# Patient Record
Sex: Female | Born: 1995 | Race: Black or African American | Hispanic: No | Marital: Single | Smoking: Never smoker
Health system: Southern US, Community
[De-identification: ages and names within clinical notes are randomized; demographics above are authoritative.]

---

## 2019-02-12 ENCOUNTER — Emergency Department
Admission: EM | Admit: 2019-02-12 | Discharge: 2019-02-12 | Disposition: A | Discharge status: Home / Self Care | Payer: Medicaid Other | Attending: Emergency Medicine | Admitting: Emergency Medicine

## 2019-02-12 ENCOUNTER — Emergency Department: Payer: Medicaid Other

## 2019-02-12 ENCOUNTER — Other Ambulatory Visit: Payer: Self-pay

## 2019-02-12 ENCOUNTER — Encounter: Payer: Self-pay | Admitting: Emergency Medicine

## 2019-02-12 DIAGNOSIS — O26891 Other specified pregnancy related conditions, first trimester: Secondary | ICD-10-CM | POA: Insufficient documentation

## 2019-02-12 DIAGNOSIS — R102 Pelvic and perineal pain unspecified side: Secondary | ICD-10-CM

## 2019-02-12 DIAGNOSIS — Z3A01 Less than 8 weeks gestation of pregnancy: Secondary | ICD-10-CM | POA: Diagnosis not present

## 2019-02-12 DIAGNOSIS — Z3491 Encounter for supervision of normal pregnancy, unspecified, first trimester: Secondary | ICD-10-CM

## 2019-02-12 LAB — COMPREHENSIVE METABOLIC PANEL
ALT: 18 U/L (ref 0–44)
AST: 19 U/L (ref 15–41)
Albumin: 4 g/dL (ref 3.5–5.0)
Alkaline Phosphatase: 37 U/L — ABNORMAL LOW (ref 38–126)
Anion gap: 5 (ref 5–15)
BUN: 9 mg/dL (ref 6–20)
CO2: 22 mmol/L (ref 22–32)
Calcium: 9.1 mg/dL (ref 8.9–10.3)
Chloride: 107 mmol/L (ref 98–111)
Creatinine, Ser: 0.62 mg/dL (ref 0.44–1.00)
GFR calc Af Amer: >60 mL/min (ref >60)
GFR calc non Af Amer: >60 mL/min (ref >60)
Glucose, Bld: 85 mg/dL (ref 70–99)
Potassium: 3.8 mmol/L (ref 3.5–5.1)
Sodium: 134 mmol/L — ABNORMAL LOW (ref 135–145)
Total Bilirubin: 0.6 mg/dL (ref 0.3–1.2)
Total Protein: 7.9 g/dL (ref 6.5–8.1)

## 2019-02-12 LAB — URINALYSIS, COMPLETE (UACMP) WITH MICROSCOPIC
Bacteria, UA: NONE SEEN
Bilirubin Urine: NEGATIVE
Glucose, UA: NEGATIVE mg/dL
Hgb urine dipstick: NEGATIVE
Ketones, ur: NEGATIVE mg/dL
Leukocytes,Ua: NEGATIVE
Nitrite: NEGATIVE
Protein, ur: NEGATIVE mg/dL
Specific Gravity, Urine: 1.025 (ref 1.005–1.030)
pH: 7 (ref 5.0–8.0)

## 2019-02-12 LAB — HCG, QUANTITATIVE, PREGNANCY: hCG, Beta Chain, Quant, S: 43960 m[IU]/mL — ABNORMAL HIGH (ref <5)

## 2019-02-12 LAB — CBC
HCT: 39.5 % (ref 36.0–46.0)
Hemoglobin: 13.3 g/dL (ref 12.0–15.0)
MCH: 29 pg (ref 26.0–34.0)
MCHC: 33.7 g/dL (ref 30.0–36.0)
MCV: 86.2 fL (ref 80.0–100.0)
Platelets: 244 10*3/uL (ref 150–400)
RBC: 4.58 MIL/uL (ref 3.87–5.11)
RDW: 12.7 % (ref 11.5–15.5)
WBC: 7.6 10*3/uL (ref 4.0–10.5)
nRBC: 0 % (ref 0.0–0.2)

## 2019-02-12 LAB — CHLAMYDIA/NGC RT PCR (ARMC ONLY)
Chlamydia Tr: NOT DETECTED
N gonorrhoeae: NOT DETECTED

## 2019-02-12 LAB — POCT PREGNANCY, URINE: Preg Test, Ur: POSITIVE — AB

## 2019-02-12 NOTE — ED Provider Notes (Signed)
[facility] Emergency Department Provider Note  Time seen: 5:32 PM  I have reviewed the triage vital signs and the nursing notes.   HISTORY  Chief Complaint Pelvic Pain   HPI Karen Hickman is a 23 y.o. female with no significant past medical history presents to the emergency department for right-sided pelvic discomfort.  According to the patient for the past 3 to 4 days she has been experiencing intermittent moderate sharp pain in her right pelvis.  Patient denies any bleeding or vaginal discharge.  Denies any dysuria.  Patient states she was recently on Nexplanon but was having significant breakthrough bleeding so her doctor switched her to oral birth control tablets.  Patient recently moved and has been out of her birth control for approximately 1 month.  Patient states she recently had a pelvic exam performed approximately 2 months ago by her OB/GYN.  Denies any fever cough congestion or shortness of breath.  History reviewed. No pertinent past medical history.  There are no active problems to display for this patient.   History reviewed. No pertinent surgical history.  Prior to Admission medications   Not on File    No Known Allergies  No family history on file.  Social History Social History   Tobacco Use  . Smoking status: Never Smoker  . Smokeless tobacco: Never Used  Substance Use Topics  . Alcohol use: Yes    Frequency: Never    Comment: rare  . Drug use: Not on file    Review of Systems Constitutional: Negative for fever ENT: Negative for recent illness/congestion Cardiovascular: Negative for chest pain. Respiratory: Negative for shortness of breath. Gastrointestinal: Intermittent sharp pains in her right pelvis.  No abdominal pain, nausea vomiting or diarrhea Genitourinary: Negative for urinary compaints Musculoskeletal: Negative for musculoskeletal complaints Skin: Negative for skin complaints  Neurological: Negative for  headache All other ROS negative  ____________________________________________   PHYSICAL EXAM:  VITAL SIGNS: ED Triage Vitals  Enc Vitals Group     BP 02/12/19 1710 (!) 115/48     Pulse Rate 02/12/19 1710 88     Resp 02/12/19 1710 20     Temp 02/12/19 1710 98.6 F (37 C)     Temp Source 02/12/19 1710 Oral     SpO2 02/12/19 1710 99 %     Weight 02/12/19 1711 175 lb (79.4 kg)     Height 02/12/19 1711 5\' 3"  (1.6 m)     Head Circumference --      Peak Flow --      Pain Score 02/12/19 1710 3     Pain Loc --      Pain Edu? --      Excl. in [place]? --    Constitutional: Alert and oriented. Well appearing and in no distress. Eyes: Normal exam ENT      Head: Normocephalic and atraumatic.      Mouth/Throat: Mucous membranes are moist. Cardiovascular: Normal rate, regular rhythm Respiratory: Normal respiratory effort without tachypnea nor retractions. Breath sounds are clear  Gastrointestinal: Abdomen overall soft and nontender.  No tenderness over McBurney's point however she has mild tenderness in the very low right lower quadrant/right pelvic area. Musculoskeletal: Nontender with normal range of motion in all extremities.  Neurologic:  Normal speech and language. No gross focal neurologic deficits Skin:  Skin is warm, dry and intact.  Psychiatric: Mood and affect are normal.  ____________________________________________    RADIOLOGY  Sound shows 6-week IUP with a subchorionic hemorrhage  ____________________________________________  INITIAL IMPRESSION / ASSESSMENT AND PLAN / ED COURSE  Pertinent labs & imaging results that were available during my care of the patient were reviewed by me and considered in my medical decision making (see chart for details).   Patient presents emergency department for right-sided pelvic pain.  Patient states pain is intermittent, moderate and sharp has been occurring over the past 4 days.  Denies any significant discomfort currently.  Denies  any pain in her abdomen.  Patient does have mild tenderness to palpation in the very low right lower quadrant/right pelvis.  I discussed performing a pelvic examination checking a urine sample and obtaining an ultrasound.  Patient states she just had a pelvic exam performed 1 to 2 months ago and does not want a repeat pelvic exam.  Discussed with the patient is important to rule out things like STDs, patient understands but still wishes to forego the pelvic exam.  We will proceed with ultrasound and send a urine sample.  Patient agreeable to plan of care.  Differential at this time would include urinary tract infection, ovarian cyst, hemorrhagic cyst, less likely STD or pelvic infection.  Overall patient's physical exam is extremely reassuring with normal vitals and a largely benign abdomen besides very slight tenderness in the very low right lower quadrant.  No tenderness over McBurney's point.  Ultrasound shows 6-week IUP with a subchorionic hemorrhage, beta-hCG confirms pregnancy as well.  Patient will be discharged home with OB follow-up.  Patient agreeable to plan of care.  Alvino BloodLatavia Brakebill was evaluated in Emergency Department on 02/12/2019 for the symptoms described in the history of present illness. She was evaluated in the context of the global COVID-19 pandemic, which necessitated consideration that the patient might be at risk for infection with the SARS-CoV-2 virus that causes COVID-19. Institutional protocols and algorithms that pertain to the evaluation of patients at risk for COVID-19 are in a state of rapid change based on information released by regulatory bodies including the CDC and federal and state organizations. These policies and algorithms were followed during the patient's care in the ED.  ____________________________________________   FINAL CLINICAL IMPRESSION(S) / ED DIAGNOSES  First trimester pregnancy   Minna AntisPaduchowski, Nicholl Onstott, MD 02/12/19 2040

## 2019-02-12 NOTE — Discharge Instructions (Addendum)
Please follow-up with OB/GYN within the next 4 weeks for recheck/reevaluation.  Return to the emergency department for any abdominal pain, vaginal bleeding, or any other symptom personally concerning to yourself.

## 2019-02-12 NOTE — ED Notes (Addendum)
Pt's POC Pregnancy Test came back POSITIVE. This test read by this tech and First Nurse Judeth Cornfield, RN also confirmed POSITIVE test.

## 2019-02-12 NOTE — ED Notes (Signed)
Patient transported to Ultrasound 

## 2019-02-12 NOTE — ED Triage Notes (Signed)
Patient to ER for c/o pelvic pain x2 days. Denies any discharge or bleeding. Patient states there is a chance that she is pregnant, but does not know for sure. Denies any fevers or burning with urination. States she was on nexplanon for one month, then was on pill. States she then moved and has been unable to get prescription for birth control.

## 2019-03-09 ENCOUNTER — Other Ambulatory Visit: Payer: Self-pay

## 2019-03-09 ENCOUNTER — Emergency Department
Admission: EM | Admit: 2019-03-09 | Discharge: 2019-03-09 | Disposition: A | Discharge status: Home / Self Care | Payer: Medicaid Other | Attending: Emergency Medicine | Admitting: Emergency Medicine

## 2019-03-09 DIAGNOSIS — R109 Unspecified abdominal pain: Secondary | ICD-10-CM | POA: Insufficient documentation

## 2019-03-09 DIAGNOSIS — O26891 Other specified pregnancy related conditions, first trimester: Secondary | ICD-10-CM | POA: Diagnosis present

## 2019-03-09 LAB — CBC WITH DIFFERENTIAL/PLATELET
Abs Immature Granulocytes: 0.01 10*3/uL (ref 0.00–0.07)
Basophils Absolute: 0 10*3/uL (ref 0.0–0.1)
Basophils Relative: 0 %
Eosinophils Absolute: 0.1 10*3/uL (ref 0.0–0.5)
Eosinophils Relative: 1 %
HCT: 39.2 % (ref 36.0–46.0)
Hemoglobin: 13.4 g/dL (ref 12.0–15.0)
Immature Granulocytes: 0 %
Lymphocytes Relative: 27 %
Lymphs Abs: 2.1 10*3/uL (ref 0.7–4.0)
MCH: 29.5 pg (ref 26.0–34.0)
MCHC: 34.2 g/dL (ref 30.0–36.0)
MCV: 86.2 fL (ref 80.0–100.0)
Monocytes Absolute: 0.3 10*3/uL (ref 0.1–1.0)
Monocytes Relative: 4 %
Neutro Abs: 5.2 10*3/uL (ref 1.7–7.7)
Neutrophils Relative %: 68 %
Platelets: 249 10*3/uL (ref 150–400)
RBC: 4.55 MIL/uL (ref 3.87–5.11)
RDW: 12.6 % (ref 11.5–15.5)
WBC: 7.7 10*3/uL (ref 4.0–10.5)
nRBC: 0 % (ref 0.0–0.2)

## 2019-03-09 LAB — BASIC METABOLIC PANEL
Anion gap: 10 (ref 5–15)
BUN: 11 mg/dL (ref 6–20)
CO2: 21 mmol/L — ABNORMAL LOW (ref 22–32)
Calcium: 9 mg/dL (ref 8.9–10.3)
Chloride: 103 mmol/L (ref 98–111)
Creatinine, Ser: 0.71 mg/dL (ref 0.44–1.00)
GFR calc Af Amer: >60 mL/min (ref >60)
GFR calc non Af Amer: >60 mL/min (ref >60)
Glucose, Bld: 85 mg/dL (ref 70–99)
Potassium: 3.8 mmol/L (ref 3.5–5.1)
Sodium: 134 mmol/L — ABNORMAL LOW (ref 135–145)

## 2019-03-09 LAB — ABO/RH: ABO/RH(D): A POS

## 2019-03-09 LAB — HCG, QUANTITATIVE, PREGNANCY: hCG, Beta Chain, Quant, S: 49443 m[IU]/mL — ABNORMAL HIGH (ref <5)

## 2019-03-09 LAB — POCT PREGNANCY, URINE: Preg Test, Ur: POSITIVE — AB

## 2019-03-09 NOTE — ED Triage Notes (Addendum)
Pt arrives to ed via POV from home. Pt states she is [redacted] weeks pregnant. Woke up with headache on rt side, that has been intermittent throughout today. C/o right sided abd cramping lasting 10-15 mins, unable to sit or stand up at that time, had to lay flat, states she felt pressure in pelvic region. Denies any vaginal bleeding, or discharge. States cramping and pain has subsided but concerned due to pregnancy.

## 2019-03-09 NOTE — ED Notes (Signed)
Pt in bathroom to collect urine specimen.

## 2019-03-09 NOTE — Discharge Instructions (Addendum)
As we discussed it is very important that you start taking prenatal vitamins. Please seek medical attention for any high fevers, chest pain, shortness of breath, change in behavior, persistent vomiting, bloody stool or any other new or concerning symptoms.

## 2019-03-09 NOTE — ED Provider Notes (Signed)
[facility] Emergency Department Provider Note   ____________________________________________   I have reviewed the triage vital signs and the nursing notes.   HISTORY  Chief Complaint Abdominal Pain   History limited by: Not Limited   HPI Karen Hickman is a 23 y.o. female who presents to the emergency department today because of concern for abdominal pain that occurred earlier today. She was reaching down when it started. Located in the right lower quadrant. It felt like severe cramping. Rated it a 10/10. She states it lasted for roughly 15 minutes before starting to ease off. The patient denies any vaginal bleeding. Is in the first trimester of pregnancy. Has not yet established ob/gyn care. In addition she states she did have a headache earlier in the day.    Records reviewed. Per medical record review patient has a history of ER visit last month with US showing a single live IUP.   No past medical history on file.  There are no active problems to display for this patient.   No past surgical history on file.  Prior to Admission medications   Not on File    Allergies Patient has no known allergies.  No family history on file.  Social History Social History   Tobacco Use  . Smoking status: Never Smoker  . Smokeless tobacco: Never Used  Substance Use Topics  . Alcohol use: Not Currently    Frequency: Never    Comment: rare  . Drug use: Not on file    Review of Systems Constitutional: No fever/chills Eyes: No visual changes. ENT: No sore throat. Cardiovascular: Denies chest pain. Respiratory: Denies shortness of breath. Gastrointestinal: Positive for abdominal pain.  Genitourinary: Negative for dysuria. Musculoskeletal: Negative for back pain. Skin: Negative for rash. Neurological: Positive for headache which has resolved.  ____________________________________________   PHYSICAL EXAM:  VITAL SIGNS: ED Triage Vitals  Enc  Vitals Group     BP 03/09/19 1745 114/73     Pulse Rate 03/09/19 1745 83     Resp 03/09/19 1745 18     Temp 03/09/19 1745 98.5 F (36.9 C)     Temp Source 03/09/19 1745 Oral     SpO2 03/09/19 1745 100 %     Weight 03/09/19 1745 185 lb (83.9 kg)     Height 03/09/19 1745 5\' 3"  (1.6 m)     Head Circumference --      Peak Flow --      Pain Score 03/09/19 1800 10   Constitutional: Alert and oriented.  Eyes: Conjunctivae are normal.  ENT      Head: Normocephalic and atraumatic.      Nose: No congestion/rhinnorhea.      Mouth/Throat: Mucous membranes are moist.      Neck: No stridor. Hematological/Lymphatic/Immunilogical: No cervical lymphadenopathy. Cardiovascular: Normal rate, regular rhythm.  No murmurs, rubs, or gallops.  Respiratory: Normal respiratory effort without tachypnea nor retractions. Breath sounds are clear and equal bilaterally. No wheezes/rales/rhonchi. Gastrointestinal: Soft and non tender. No rebound. No guarding.  Genitourinary: Deferred Musculoskeletal: Normal range of motion in all extremities. Neurologic:  Normal speech and language. No gross focal neurologic deficits are appreciated.  Skin:  Skin is warm, dry and intact. No rash noted. Psychiatric: Mood and affect are normal. Speech and behavior are normal. Patient exhibits appropriate insight and judgment.  ____________________________________________    LABS (pertinent positives/negatives)  CBC wbc 7.7, hgb 13.4, plt 249 BMP wnl except na 134, co2 21 Upreg positive  ____________________________________________  EKG  None  ____________________________________________    RADIOLOGY  None  ____________________________________________   PROCEDURES  Procedures  ____________________________________________   INITIAL IMPRESSION / ASSESSMENT AND PLAN / ED COURSE  Pertinent labs & imaging results that were available during my care of the patient were reviewed by me and considered in my  medical decision making (see chart for details).   Patient presented to the emergency department today because of concern for abdominal pain in the setting of early pregnancy. Blood work without concerning leukocytosis and patient is afebrile. I doubt appendicitis or other intraabdominal infection. Bedside ultrasound shows a single IUP with good cardiac activity and motion. Did discuss with patient importance of ob/gyn follow up and starting prenatal vitamins.  ____________________________________________   FINAL CLINICAL IMPRESSION(S) / ED DIAGNOSES  Final diagnoses:  Abdominal pain during pregnancy in first trimester     Note: This dictation was prepared with Dragon dictation. Any transcriptional errors that result from this process are unintentional     Phineas SemenGoodman, Ethie Curless, MD 03/09/19 1925

## 2019-05-02 ENCOUNTER — Encounter: Payer: Self-pay | Admitting: Emergency Medicine

## 2019-05-02 ENCOUNTER — Emergency Department
Admission: EM | Admit: 2019-05-02 | Discharge: 2019-05-02 | Disposition: A | Discharge status: Home / Self Care | Payer: Medicaid Other | Attending: Emergency Medicine | Admitting: Emergency Medicine

## 2019-05-02 ENCOUNTER — Other Ambulatory Visit: Payer: Self-pay

## 2019-05-02 DIAGNOSIS — J0100 Acute maxillary sinusitis, unspecified: Secondary | ICD-10-CM | POA: Insufficient documentation

## 2019-05-02 DIAGNOSIS — R0981 Nasal congestion: Secondary | ICD-10-CM | POA: Diagnosis present

## 2019-05-02 MED ORDER — AMOXICILLIN 500 MG PO CAPS
500.0000 mg | ORAL_CAPSULE | Freq: Once | ORAL | Status: AC
  Administered 2019-05-02: 500 mg via ORAL
  Filled 2019-05-02: qty 1

## 2019-05-02 MED ORDER — CETIRIZINE HCL 10 MG PO CAPS: 10.0000 mg | ORAL_CAPSULE | Freq: Every day | ORAL | 0 refills | Status: DC

## 2019-05-02 MED ORDER — AMOXICILLIN 500 MG PO TABS: 500.0000 mg | ORAL_TABLET | Freq: Three times a day (TID) | ORAL | 0 refills | Status: DC

## 2019-05-02 NOTE — ED Triage Notes (Signed)
Pt presents to ED with sinus pressure and nasal drainage for a couple of months. 5 months pregnant. otc medications taken with no relief. Clear nasal drainage.

## 2019-05-02 NOTE — ED Provider Notes (Signed)
[facility] Emergency Department Provider Note ____________________________________________   First MD Initiated Contact with Patient 05/02/19 2236     (approximate)  I have reviewed the triage vital signs and the nursing notes.   HISTORY  Chief Complaint Nasal Congestion  HPI Karen Hickman is a 23 y.o. female who presents to the ER for treatment and evaluation of sinus pressure and nasal drainage for a couple of months.  She states that she has had some postnasal drip last couple months and feels like there is just started to turn into more of a sinus infection.  She has had some pressure behind her eyes and a slight headache.  She now has pain in her sinus areas as well.  She is approximately 5 months pregnant and was afraid to take anything without being evaluated.         History reviewed. No pertinent past medical history.  There are no active problems to display for this patient.   History reviewed. No pertinent surgical history.  Prior to Admission medications   Medication Sig Start Date End Date Taking? Authorizing Provider  amoxicillin (AMOXIL) 500 MG tablet Take 1 tablet (500 mg total) by mouth 3 (three) times daily. 05/02/19   Saud Bail, Johnette Abraham B, FNP  Cetirizine HCl 10 MG CAPS Take 1 capsule (10 mg total) by mouth daily. 05/02/19   Victorino Dike, FNP    Allergies Patient has no known allergies.  History reviewed. No pertinent family history.  Social History Social History   Tobacco Use  . Smoking status: Never Smoker  . Smokeless tobacco: Never Used  Substance Use Topics  . Alcohol use: Not Currently    Frequency: Never    Comment: rare  . Drug use: Not on file    Review of Systems  Constitutional: No fever/chills Eyes: No visual changes. ENT: No sore throat.  Positive for facial pain Cardiovascular: Denies chest pain. Respiratory: Denies shortness of breath. Gastrointestinal: No abdominal pain.  No nausea, no vomiting.   No diarrhea.  No constipation. Genitourinary: Negative for dysuria. Musculoskeletal: Negative for back pain. Skin: Negative for rash. Neurological: Positive for headaches, focal weakness or numbness.  ____________________________________________   PHYSICAL EXAM:  VITAL SIGNS: ED Triage Vitals  Enc Vitals Group     BP 05/02/19 2100 125/70     Pulse Rate 05/02/19 2100 99     Resp 05/02/19 2100 20     Temp 05/02/19 2100 98.4 F (36.9 C)     Temp Source 05/02/19 2100 Oral     SpO2 05/02/19 2100 99 %     Weight 05/02/19 2101 200 lb (90.7 kg)     Height 05/02/19 2101 5\' 3"  (1.6 m)     Head Circumference --      Peak Flow --      Pain Score 05/02/19 2101 2     Pain Loc --      Pain Edu? --      Excl. in [place]? --     Constitutional: Alert and oriented. Well appearing and in no acute distress. Eyes: Conjunctivae are normal. PERRL. EOMI. Head: Atraumatic.  Tender over bilateral maxillary sinus areas Nose: No congestion/rhinnorhea. Mouth/Throat: Mucous membranes are moist.  Oropharynx non-erythematous. Neck: No stridor.   Hematological/Lymphatic/Immunilogical: No cervical lymphadenopathy. Cardiovascular: Normal rate, regular rhythm. Grossly normal heart sounds.  Good peripheral circulation. Respiratory: Normal respiratory effort.  No retractions. Lungs CTAB. Gastrointestinal: Soft and nontender. No distention. No abdominal bruits. No CVA tenderness. Genitourinary:  Musculoskeletal:  No lower extremity tenderness nor edema.  No joint effusions. Neurologic:  Normal speech and language. No gross focal neurologic deficits are appreciated. No gait instability. Skin:  Skin is warm, dry and intact. No rash noted. Psychiatric: Mood and affect are normal. Speech and behavior are normal.  ____________________________________________   LABS (all labs ordered are listed, but only abnormal results are displayed)  Labs Reviewed - No data to display  ____________________________________________  EKG  Not indicated ____________________________________________  RADIOLOGY  ED MD interpretation:    Not indicated  Official radiology report(s): No results found.  ____________________________________________   PROCEDURES  Procedure(s) performed (including Critical Care):  Procedures  ____________________________________________   INITIAL IMPRESSION / ASSESSMENT AND PLAN     23 year old female presenting to the emergency department for treatment and evaluation of nasal congestion.  See HPI for further details.  ED COURSE  Patient will be treated with amoxicillin and cetirizine.  She was advised that she should follow-up with her primary care provider if she has been like this is helping.  She was advised that she can take Tylenol if she needs of her headache.  She was advised to return to the emergency department for symptoms of concern if unable to schedule an appointment with primary care or her OB/GYN. ____________________________________________   FINAL CLINICAL IMPRESSION(S) / ED DIAGNOSES  Final diagnoses:  Acute non-recurrent maxillary sinusitis     ED Discharge Orders         Ordered    amoxicillin (AMOXIL) 500 MG tablet  3 times daily     05/02/19 2246    Cetirizine HCl 10 MG CAPS  Daily,   Status:  Discontinued     05/02/19 2246    Cetirizine HCl 10 MG CAPS  Daily     05/02/19 2248           Note:  This document was prepared using Dragon voice recognition software and may include unintentional dictation errors.   Chinita Pesterriplett, Kitzia Camus B, FNP 05/03/19 2246    Jeanmarie PlantMcShane, James A, MD 05/03/19 [phone]

## 2019-05-02 NOTE — Discharge Instructions (Signed)
You may also take plain Mucinex if needed. Take tylenol if needed for headache.

## 2019-05-31 ENCOUNTER — Emergency Department
Admission: EM | Admit: 2019-05-31 | Discharge: 2019-05-31 | Disposition: A | Discharge status: Home / Self Care | Payer: Medicaid Other | Attending: Emergency Medicine | Admitting: Emergency Medicine

## 2019-05-31 ENCOUNTER — Other Ambulatory Visit: Payer: Self-pay

## 2019-05-31 DIAGNOSIS — R0982 Postnasal drip: Secondary | ICD-10-CM | POA: Diagnosis not present

## 2019-05-31 DIAGNOSIS — R070 Pain in throat: Secondary | ICD-10-CM | POA: Diagnosis present

## 2019-05-31 DIAGNOSIS — Z79899 Other long term (current) drug therapy: Secondary | ICD-10-CM | POA: Insufficient documentation

## 2019-05-31 LAB — GROUP A STREP BY PCR: Group A Strep by PCR: NOT DETECTED

## 2019-05-31 MED ORDER — CETIRIZINE HCL 10 MG PO TABS: 10.0000 mg | ORAL_TABLET | Freq: Every day | ORAL | 0 refills | Status: AC

## 2019-05-31 MED ORDER — FLUTICASONE PROPIONATE 50 MCG/ACT NA SUSP: 1.0000 | Freq: Two times a day (BID) | NASAL | 0 refills | Status: DC

## 2019-05-31 NOTE — ED Triage Notes (Signed)
Reports she was prescribed amoxicillin for URI X 1 month ago. Pt states that she does not take the ABX as prescribed but takes them on days that she does not feel well. Thinks that the amoxicillin is making it difficult for her to swallow.

## 2019-05-31 NOTE — ED Provider Notes (Signed)
[facility] Emergency Department Provider Note  ____________________________________________  Time seen: Approximately 6:14 PM  I have reviewed the triage vital signs and the nursing notes.   HISTORY  Chief Complaint Throat Irritation    HPI Karen Hickman is a 23 y.o. female who presents the emergency department complaining of constant sensation of "something in the back of my throat."  Patient was seen approximately a month ago diagnosed with a sinus infection and placed on antibiotics and Zyrtec.  Patient reports that she is not taking any of the Zyrtec, only takes the antibiotic 1 day at a time depending on her symptoms.  Patient has had no fevers or chills, sinus pressure, headache, cough, shortness of breath, abdominal pain, nausea or vomiting.  Patient is pregnant at this time and has not been attempting use of other over-the-counter medications due to her pregnancy status.   Patient denies any complaints.  Patient is pregnant denies any pregnancy concerns.  Patient is G2, P1.  Gestational age is 22 weeks 1 day       History reviewed. No pertinent past medical history.  There are no active problems to display for this patient.   History reviewed. No pertinent surgical history.  Prior to Admission medications   Medication Sig Start Date End Date Taking? Authorizing Provider  amoxicillin (AMOXIL) 500 MG tablet Take 1 tablet (500 mg total) by mouth 3 (three) times daily. 05/02/19   Triplett, Rulon Eisenmenger B, FNP  cetirizine (ZYRTEC) 10 MG tablet Take 1 tablet (10 mg total) by mouth daily. 05/31/19   Tyhir Schwan, Delorise Royals, PA-C  fluticasone (FLONASE) 50 MCG/ACT nasal spray Place 1 spray into both nostrils 2 (two) times daily. 05/31/19   Lamondre Wesche, Delorise Royals, PA-C    Allergies Patient has no known allergies.  No family history on file.  Social History Social History   Tobacco Use  . Smoking status: Never Smoker  . Smokeless tobacco: Never Used  Substance  Use Topics  . Alcohol use: Not Currently    Frequency: Never    Comment: rare  . Drug use: Not on file     Review of Systems  Constitutional: No fever/chills Eyes: No visual changes. No discharge ENT: Congested/foreign body sensation in the back of the throat. Cardiovascular: no chest pain. Respiratory: no cough. No SOB. Gastrointestinal: No abdominal pain.  No nausea, no vomiting.  No diarrhea.  No constipation. Musculoskeletal: Negative for musculoskeletal pain. Skin: Negative for rash, abrasions, lacerations, ecchymosis. Neurological: Negative for headaches, focal weakness or numbness. 10-point ROS otherwise negative.  ____________________________________________   PHYSICAL EXAM:  VITAL SIGNS: ED Triage Vitals [05/31/19 1746]  Enc Vitals Group     BP 113/69     Pulse Rate 93     Resp 18     Temp 98.7 F (37.1 C)     Temp Source Oral     SpO2 100 %     Weight 209 lb (94.8 kg)     Height 5\' 3"  (1.6 m)     Head Circumference      Peak Flow      Pain Score 0     Pain Loc      Pain Edu?      Excl. in GC?      Constitutional: Alert and oriented. Well appearing and in no acute distress. Eyes: Conjunctivae are normal. PERRL. EOMI. Head: Atraumatic. ENT:      Ears:       Nose: No congestion/rhinnorhea.  Turbinates are boggy.  No tenderness to percussion over the sinuses.      Mouth/Throat: Mucous membranes are moist.  Oropharynx is nonerythematous and nonedematous.  Uvula is midline.  Posterior nasal drainage is identified in the posterior oropharynx.  No other findings in the oropharyngeal cavity Neck: No stridor.  Neck is supple full range of motion Hematological/Lymphatic/Immunilogical: No cervical lymphadenopathy. Cardiovascular: Normal rate, regular rhythm. Normal S1 and S2.  Good peripheral circulation. Respiratory: Normal respiratory effort without tachypnea or retractions. Lungs CTAB. Good air entry to the bases with no decreased or absent breath  sounds. Gastrointestinal: Gravid abdomen.  Bowel sounds 4 quadrants. Soft and nontender to palpation. No guarding or rigidity. No palpable masses. No distention. No CVA tenderness. Musculoskeletal: Full range of motion to all extremities. No gross deformities appreciated. Neurologic:  Normal speech and language. No gross focal neurologic deficits are appreciated.  Skin:  Skin is warm, dry and intact. No rash noted. Psychiatric: Mood and affect are normal. Speech and behavior are normal. Patient exhibits appropriate insight and judgement.   ____________________________________________   LABS (all labs ordered are listed, but only abnormal results are displayed)  Labs Reviewed  GROUP A STREP BY PCR   ____________________________________________  EKG   ____________________________________________  RADIOLOGY   No results found.  ____________________________________________    PROCEDURES  Procedure(s) performed:    Procedures    Medications - No data to display   ____________________________________________   INITIAL IMPRESSION / ASSESSMENT AND PLAN / ED COURSE  Pertinent labs & imaging results that were available during my care of the patient were reviewed by me and considered in my medical decision making (see chart for details).  Review of the [place] CSRS was performed in accordance of the [place] prior to dispensing any controlled drugs.  Clinical Course as of May 30 1850  Fri May 31, 2019  1848 Patient presented to the emergency department with foreign body sensation in throat.  Patient feels like the uvula is swollen.  Exam reveals postnasal drip but no other identifiable findings on physical exam.  Patient has been complaining of symptoms x1 month.  She was placed on antibiotics for sinus infection but did not take them as prescribed.  Patient has taken a dose of antibiotic as she is felt symptoms.  On review of patient's chart, it appears that patient has seen  multiple providers for similar complaint over the past month.  Patient has been diagnosed with postnasal drip/allergic rhinitis by multiple different providers and started on allergy medications as well as the antibiotic from this department.  Patient has not taken any of the allergy medications, only takes the antibiotics 1 dose at a time when she feels symptoms.  Findings today are consistent with allergic rhinitis with postnasal drip.  Patient will again be prescribed Zyrtec and Flonase.  I will test for strep but have a very low suspicion at this time.   [JC]    Clinical Course User Index [JC] Danon Lograsso, Charline Bills, PA-C          Patient's diagnosis is consistent with postnasal drip.  Exam was reassuring.  No acute findings warranting further investigation other than strep test.  Exam revealed no findings concerning for peritonsillar abscess, retropharyngeal abscess, Ludwick's angina, Lemierre's disease.  Differential included pharyngitis, strep pharyngitis, postnasal drip, viral URI, sinusitis.  Patient will be placed on Flonase and Zyrtec for symptom relief.  Follow-up with primary care or OB/GYN as needed.. Patient is given ED precautions to return to the ED for any worsening  or new symptoms.     ____________________________________________  FINAL CLINICAL IMPRESSION(S) / ED DIAGNOSES  Final diagnoses:  Post-nasal drip      NEW MEDICATIONS STARTED DURING THIS VISIT:  ED Discharge Orders         Ordered    fluticasone (FLONASE) 50 MCG/ACT nasal spray  2 times daily     05/31/19 1850    cetirizine (ZYRTEC) 10 MG tablet  Daily     05/31/19 1850              This chart was dictated using voice recognition software/Dragon. Despite best efforts to proofread, errors can occur which can change the meaning. Any change was purely unintentional.    Racheal PatchesCuthriell, Charan Prieto D, PA-C 05/31/19 1851    Shaune PollackIsaacs, [place], MD 06/01/19 1029

## 2019-05-31 NOTE — ED Notes (Signed)
PT states feels like something is in her throat often and has to clear her throat of saliva and mucous. No trouble breathing, Respirations even and unlabored, voice is clear.

## 2019-09-13 ENCOUNTER — Encounter: Payer: Self-pay | Admitting: Emergency Medicine

## 2019-09-13 ENCOUNTER — Other Ambulatory Visit: Payer: Self-pay

## 2019-09-13 ENCOUNTER — Emergency Department: Payer: Medicaid Other

## 2019-09-13 ENCOUNTER — Emergency Department
Admission: EM | Admit: 2019-09-13 | Discharge: 2019-09-13 | Disposition: A | Discharge status: Home / Self Care | Payer: Medicaid Other | Attending: Student in an Organized Health Care Education/Training Program | Admitting: Student in an Organized Health Care Education/Training Program

## 2019-09-13 DIAGNOSIS — Z79899 Other long term (current) drug therapy: Secondary | ICD-10-CM | POA: Insufficient documentation

## 2019-09-13 DIAGNOSIS — M79604 Pain in right leg: Secondary | ICD-10-CM | POA: Insufficient documentation

## 2019-09-13 DIAGNOSIS — R300 Dysuria: Secondary | ICD-10-CM | POA: Diagnosis present

## 2019-09-13 DIAGNOSIS — Z3A37 37 weeks gestation of pregnancy: Secondary | ICD-10-CM | POA: Insufficient documentation

## 2019-09-13 LAB — URINALYSIS, ROUTINE W REFLEX MICROSCOPIC
Bilirubin Urine: NEGATIVE
Glucose, UA: NEGATIVE mg/dL
Hgb urine dipstick: NEGATIVE
Ketones, ur: NEGATIVE mg/dL
Nitrite: NEGATIVE
Protein, ur: NEGATIVE mg/dL
Specific Gravity, Urine: 1.016 (ref 1.005–1.030)
pH: 7 (ref 5.0–8.0)

## 2019-09-13 LAB — WET PREP, GENITAL
Clue Cells Wet Prep HPF POC: NONE SEEN
Sperm: NONE SEEN
Trich, Wet Prep: NONE SEEN
Yeast Wet Prep HPF POC: NONE SEEN

## 2019-09-13 NOTE — ED Triage Notes (Signed)
Pt here with c/o burning with urination, is concerned for UTI, STD/STI. Wanting to be checked for all. States white discharge, also mucus discharge, pt is [redacted] weeks pregnant. Denies blood in urine, positive fetal movement, NAD.

## 2019-09-13 NOTE — ED Notes (Signed)
See triage note  Presents with some dysuria for couple of days        Pt is 37 weeks preg and having some right calf tenderness  Ambulates well

## 2019-09-13 NOTE — Discharge Instructions (Addendum)
Follow-up with your regular doctor.  Please return emergency department worsening.

## 2019-09-13 NOTE — ED Provider Notes (Signed)
[facility] Emergency Department Provider Note  ____________________________________________   First MD Initiated Contact with Patient 09/13/19 1145     (approximate)  I have reviewed the triage vital signs and the nursing notes.   HISTORY  Chief Complaint Urinary Tract Infection    HPI Karen Hickman is a 23 y.o. female presents emergency department complaint of burning with urination, unsure if she has an STD due to the vaginal discharge, and right lower extremity pain.  States her physician told her to get the right lower leg evaluated.  She states she thinks she has a UTI but since she is the test is negative she likely tested for STDs.  She denies any fever or chills.  Positive fetal movement.    History reviewed. No pertinent past medical history.  There are no problems to display for this patient.   History reviewed. No pertinent surgical history.  Prior to Admission medications   Medication Sig Start Date End Date Taking? Authorizing Provider  cetirizine (ZYRTEC) 10 MG tablet Take 1 tablet (10 mg total) by mouth daily. 05/31/19   Cuthriell, Charline Bills, PA-C    Allergies Patient has no known allergies.  No family history on file.  Social History Social History   Tobacco Use  . Smoking status: Never Smoker  . Smokeless tobacco: Never Used  Substance Use Topics  . Alcohol use: Not Currently    Comment: rare  . Drug use: Not on file    Review of Systems  Constitutional: No fever/chills Eyes: No visual changes. ENT: No sore throat. Respiratory: Denies cough Genitourinary: Negative for dysuria.  Positive vaginal discharge Musculoskeletal: Negative for back pain.  Positive right lower extremity pain Skin: Negative for rash.    ____________________________________________   PHYSICAL EXAM:  VITAL SIGNS: ED Triage Vitals  Enc Vitals Group     BP 09/13/19 1007 97/60     Pulse Rate 09/13/19 1007 100     Resp 09/13/19 1007 15      Temp 09/13/19 1007 98 F (36.7 C)     Temp Source 09/13/19 1007 Oral     SpO2 09/13/19 1007 100 %     Weight 09/13/19 1007 214 lb (97.1 kg)     Height 09/13/19 1007 5\' 3"  (1.6 m)     Head Circumference --      Peak Flow --      Pain Score 09/13/19 1026 0     Pain Loc --      Pain Edu? --      Excl. in [place]? --     Constitutional: Alert and oriented. Well appearing and in no acute distress. Eyes: Conjunctivae are normal.  Head: Atraumatic. Nose: No congestion/rhinnorhea. Mouth/Throat: Mucous membranes are moist.   Neck:  supple no lymphadenopathy noted Cardiovascular: Normal rate, regular rhythm.  Respiratory: Normal respiratory effort.  No retractions,  GU: No herpetic lesions noted.  White vaginal discharge noted Musculoskeletal: FROM all extremities, warm and well perfused, right lumbar is tender posteriorly, no redness is noted, neurovascular is intact Neurologic:  Normal speech and language.  Skin:  Skin is warm, dry and intact. No rash noted. Psychiatric: Mood and affect are normal. Speech and behavior are normal.  ____________________________________________   LABS (all labs ordered are listed, but only abnormal results are displayed)  Labs Reviewed  WET PREP, GENITAL - Abnormal; Notable for the following components:      Result Value   WBC, Wet Prep HPF POC FEW (*)  All other components within normal limits  URINALYSIS, ROUTINE W REFLEX MICROSCOPIC - Abnormal; Notable for the following components:   Color, Urine YELLOW (*)    APPearance CLEAR (*)    Leukocytes,Ua TRACE (*)    Bacteria, UA RARE (*)    All other components within normal limits  GC/CHLAMYDIA PROBE AMP   ____________________________________________   ____________________________________________  RADIOLOGY  Ultrasound right lower extremity is negative  ____________________________________________   PROCEDURES  Procedure(s) performed:  No  Procedures    ____________________________________________   INITIAL IMPRESSION / ASSESSMENT AND PLAN / ED COURSE  Pertinent labs & imaging results that were available during my care of the patient were reviewed by me and considered in my medical decision making (see chart for details).   Patient is 23 year old female presents emergency department with dysuria and right lower extremity pain.  She is [redacted] weeks pregnant.  Physical exam shows the right calf to be mildly tender.  Pelvic exam shows white discharge.  Ultrasound right lower extremity is negative for DVT Wet prep shows white blood cells only. GC/chlamydia is pending.    Explained findings to the patient.  Due to the low risk of an STD I advised her to wait for the results prior to being treated.  Especially since she is [redacted] weeks pregnant.  Nursing staff is to call her with the results.  Explained her she has not heard from [place] in 1 week to call the emergency department for her results.  She states she understands will comply.  She is to follow-up with regular doctor.  She was discharged stable condition.   Karen Hickman was evaluated in Emergency Department on 09/13/2019 for the symptoms described in the history of present illness. She was evaluated in the context of the global COVID-19 pandemic, which necessitated consideration that the patient might be at risk for infection with the SARS-CoV-2 virus that causes COVID-19. Institutional protocols and algorithms that pertain to the evaluation of patients at risk for COVID-19 are in a state of rapid change based on information released by regulatory bodies including the CDC and federal and state organizations. These policies and algorithms were followed during the patient's care in the ED.   As part of my medical decision making, I reviewed the following data within the electronic MEDICAL RECORD NUMBER Nursing notes reviewed and incorporated, Labs reviewed , Old chart reviewed,  Radiograph reviewed , Notes from prior ED visits and [place] Controlled Substance Database  ____________________________________________   FINAL CLINICAL IMPRESSION(S) / ED DIAGNOSES  Final diagnoses:  Dysuria  Pain of right lower extremity      NEW MEDICATIONS STARTED DURING THIS VISIT:  Discharge Medication List as of 09/13/2019  1:32 PM       Note:  This document was prepared using Dragon voice recognition software and may include unintentional dictation errors.    Faythe Ghee, PA-C 09/13/19 1508    Willy Eddy, MD 09/13/19 [phone]

## 2019-09-26 IMAGING — US OBSTETRIC <14 WK US AND TRANSVAGINAL OB US WITH DOPPLER
1 series · 14 of 28 positions shown · non-contrast
Comparison: None.

CLINICAL DATA: Pelvic pain

EXAM:
OBSTETRIC <14 WK US AND TRANSVAGINAL OB
TECHNIQUE: Both transabdominal and transvaginal ultrasound examinations were
performed for complete evaluation of the gestation as well as the
maternal uterus, adnexal regions, and pelvic cul-de-sac.
Transvaginal technique was performed to assess early pregnancy.

[Series 1: obstetric <14 wk us and transvaginal ob us with do · 14 of 106 slices shown]
[im 4/106]
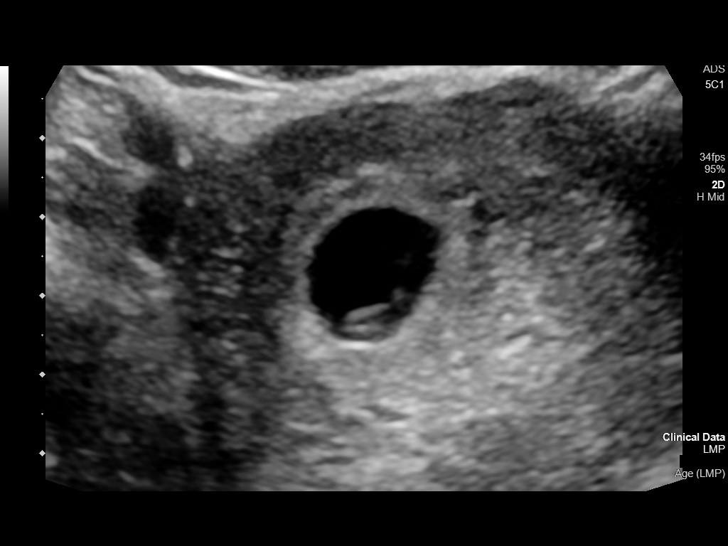
[im 12/106]
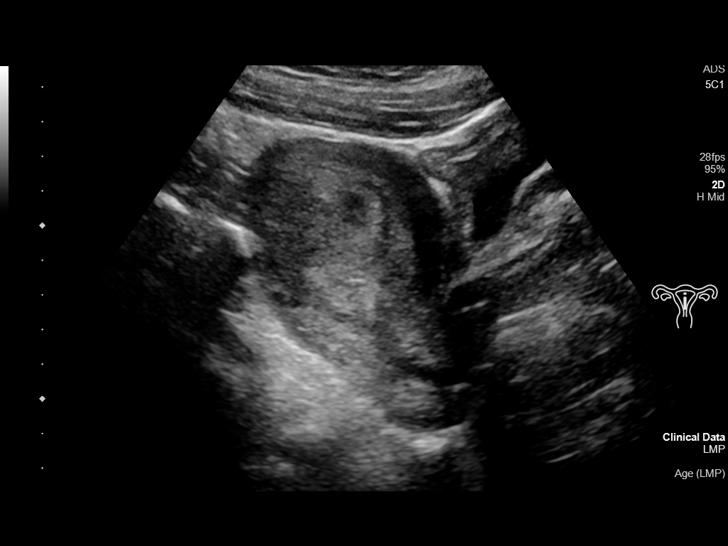
[im 20/106]
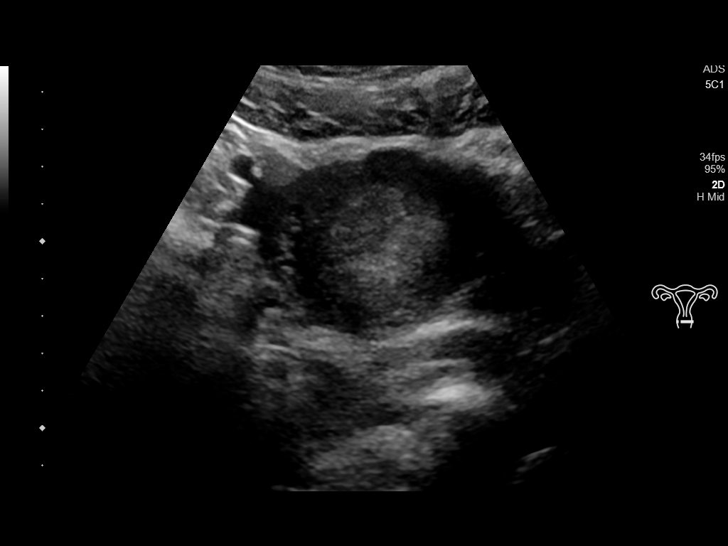
[im 28/106]
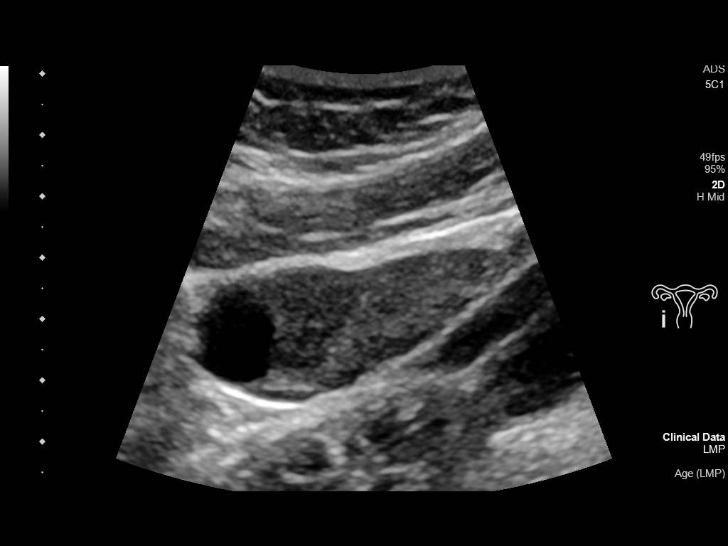
[im 36/106]
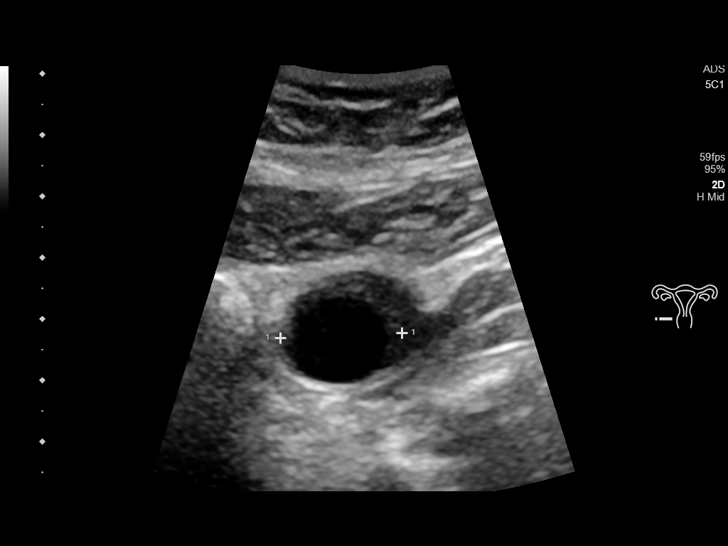
[im 43/106]
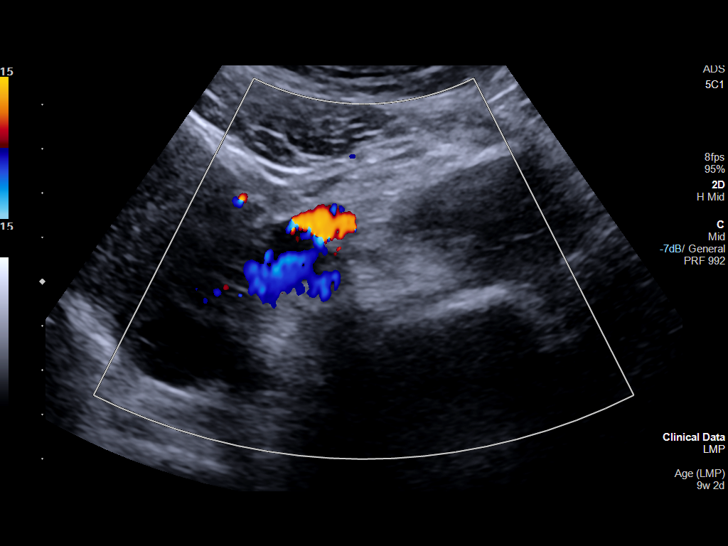
[im 51/106]
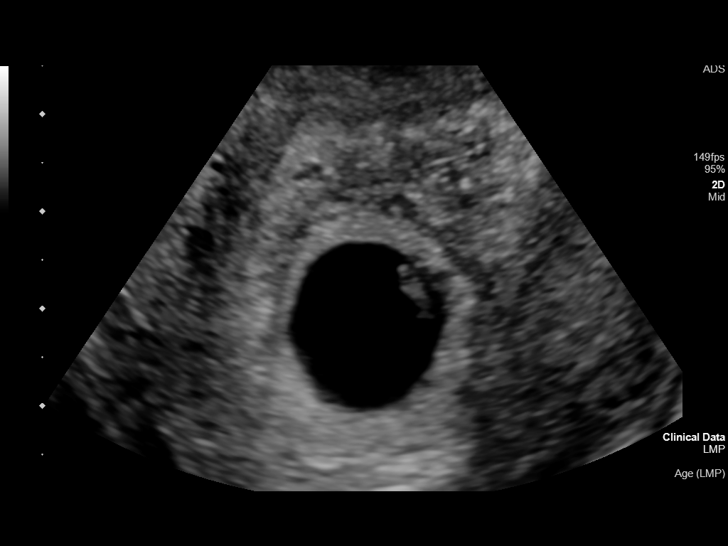
[im 59/106]
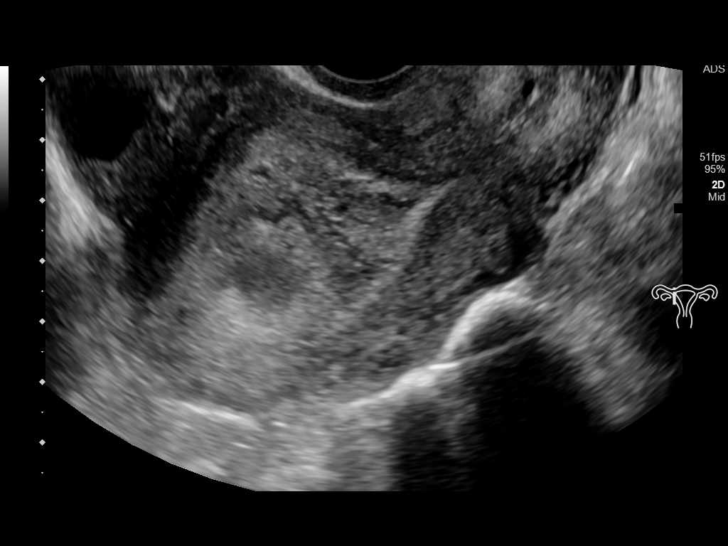
[im 67/106]
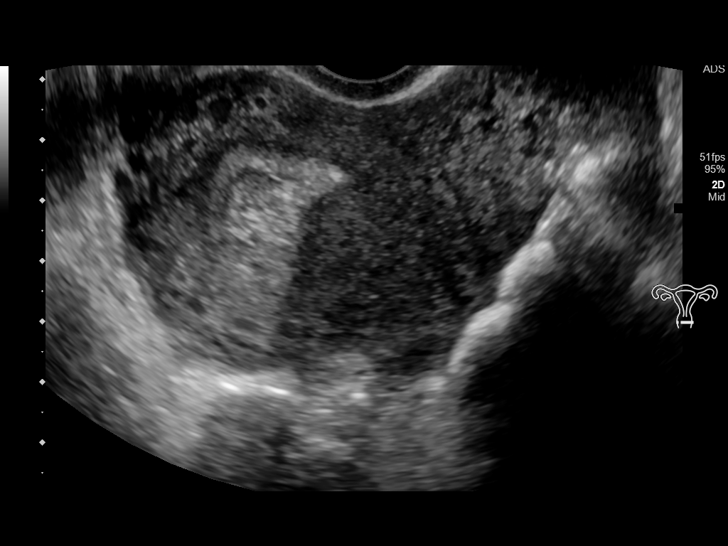
[im 74/106]
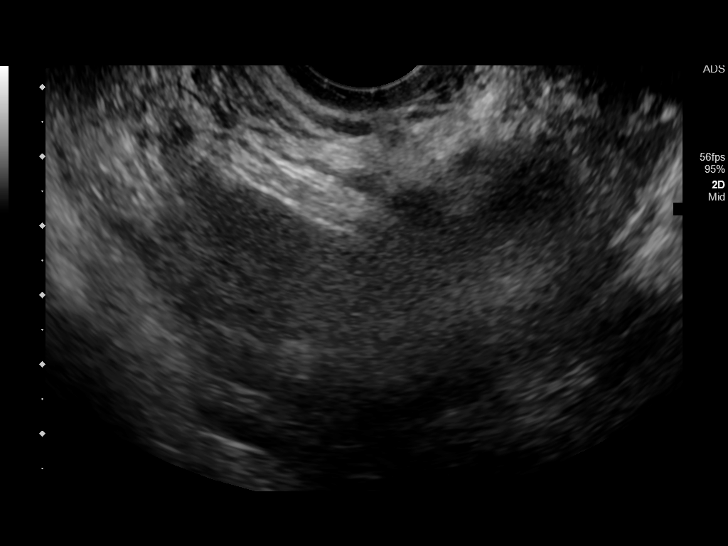
[im 82/106]
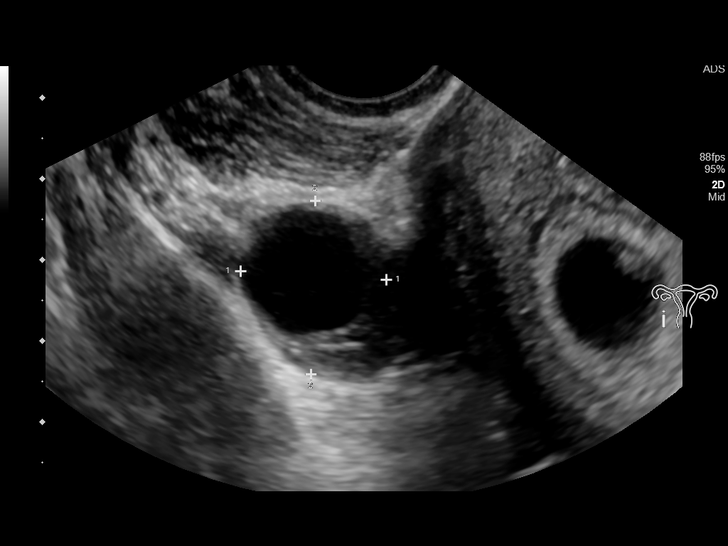
[im 90/106]
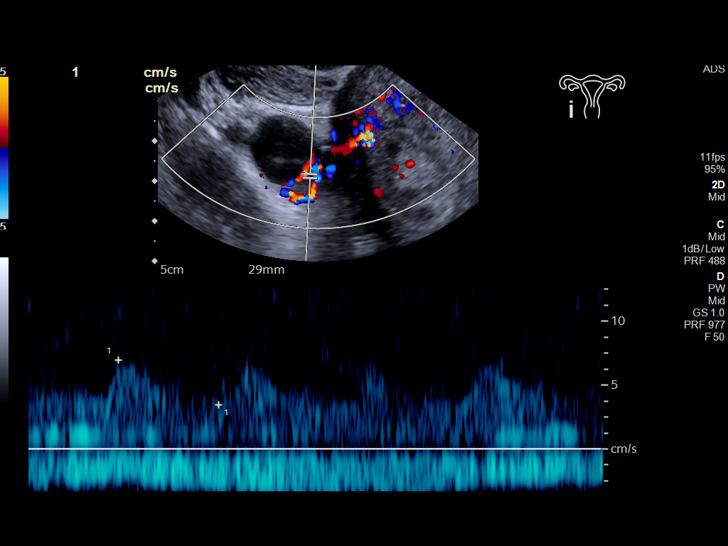
[im 98/106]
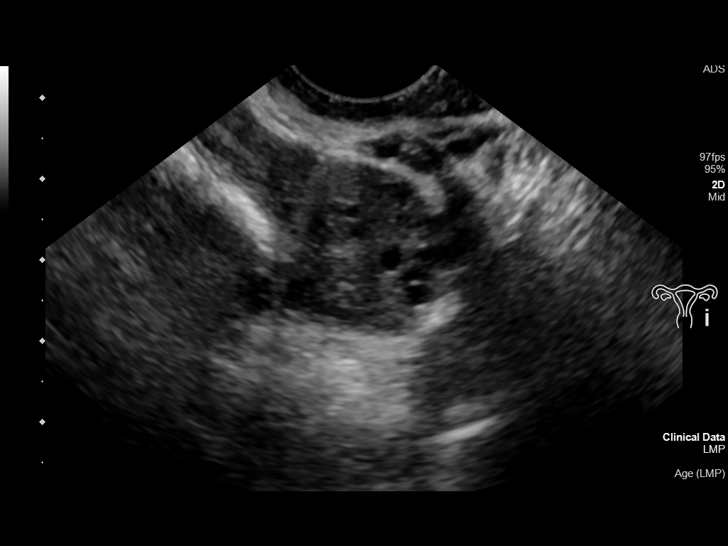
[im 106/106]
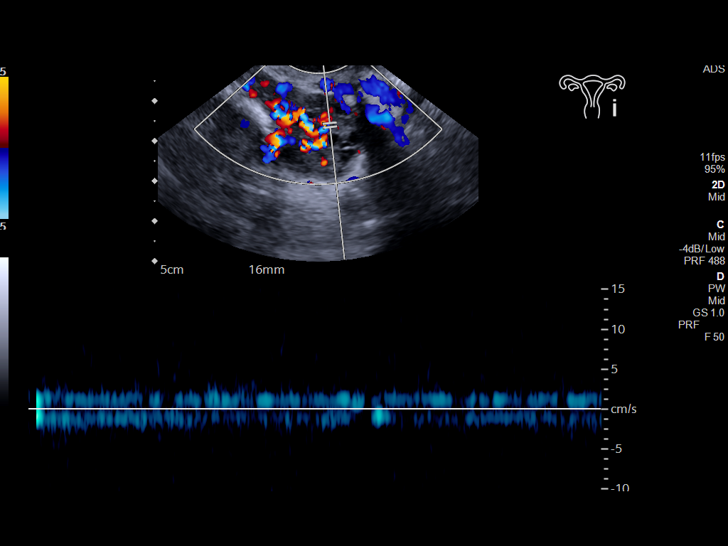

[14 of 28 positions shown; findings below may reference images not displayed]

FINDINGS: Intrauterine gestational sac: Visualized

Yolk sac: Visualized

Embryo:  Visualized

Cardiac Activity: Visualized

Heart Rate: 124 bpm

CRL:   5 mm  6 w 2 d                  US EDC: October 06, 2019

Subchorionic hemorrhage: There is a subchorionic hemorrhage
measuring 2.3 x 2.3 x 2.4 cm.

Maternal uterus/adnexae: Cervical os is closed. Right ovary measures
1.9 x 3.2 x 3.7 cm. Left ovary measures 2.0 x 2.3 x 2.1 cm. Corpus
luteum noted on the right. No free pelvic fluid.
IMPRESSION: Single live intrauterine gestation with estimated gestational age of
6+ weeks. Subchorionic hemorrhage measuring 2.3 x 2.3 x 2.4 cm.

## 2019-11-30 ENCOUNTER — Encounter: Payer: Self-pay | Admitting: Emergency Medicine

## 2019-11-30 ENCOUNTER — Other Ambulatory Visit: Payer: Self-pay

## 2019-11-30 ENCOUNTER — Emergency Department
Admission: EM | Admit: 2019-11-30 | Discharge: 2019-11-30 | Disposition: A | Discharge status: Home / Self Care | Payer: Medicaid Other | Attending: Emergency Medicine | Admitting: Emergency Medicine

## 2019-11-30 DIAGNOSIS — M545 Low back pain, unspecified: Secondary | ICD-10-CM

## 2019-11-30 DIAGNOSIS — L819 Disorder of pigmentation, unspecified: Secondary | ICD-10-CM | POA: Diagnosis not present

## 2019-11-30 DIAGNOSIS — Z79899 Other long term (current) drug therapy: Secondary | ICD-10-CM | POA: Diagnosis not present

## 2019-11-30 LAB — URINALYSIS, COMPLETE (UACMP) WITH MICROSCOPIC
Bacteria, UA: NONE SEEN
Bilirubin Urine: NEGATIVE
Glucose, UA: NEGATIVE mg/dL
Hgb urine dipstick: NEGATIVE
Ketones, ur: NEGATIVE mg/dL
Nitrite: NEGATIVE
Protein, ur: 100 mg/dL — AB
Specific Gravity, Urine: 1.027 (ref 1.005–1.030)
pH: 5 (ref 5.0–8.0)

## 2019-11-30 LAB — COMPREHENSIVE METABOLIC PANEL
ALT: 22 U/L (ref 0–44)
AST: 20 U/L (ref 15–41)
Albumin: 4 g/dL (ref 3.5–5.0)
Alkaline Phosphatase: 55 U/L (ref 38–126)
Anion gap: 12 (ref 5–15)
BUN: 14 mg/dL (ref 6–20)
CO2: 23 mmol/L (ref 22–32)
Calcium: 9.3 mg/dL (ref 8.9–10.3)
Chloride: 103 mmol/L (ref 98–111)
Creatinine, Ser: 0.91 mg/dL (ref 0.44–1.00)
GFR calc Af Amer: >60 mL/min (ref >60)
GFR calc non Af Amer: >60 mL/min (ref >60)
Glucose, Bld: 129 mg/dL — ABNORMAL HIGH (ref 70–99)
Potassium: 3.4 mmol/L — ABNORMAL LOW (ref 3.5–5.1)
Sodium: 138 mmol/L (ref 135–145)
Total Bilirubin: 0.8 mg/dL (ref 0.3–1.2)
Total Protein: 7.5 g/dL (ref 6.5–8.1)

## 2019-11-30 LAB — CBC
HCT: 41 % (ref 36.0–46.0)
Hemoglobin: 13.3 g/dL (ref 12.0–15.0)
MCH: 28 pg (ref 26.0–34.0)
MCHC: 32.4 g/dL (ref 30.0–36.0)
MCV: 86.3 fL (ref 80.0–100.0)
Platelets: 265 10*3/uL (ref 150–400)
RBC: 4.75 MIL/uL (ref 3.87–5.11)
RDW: 12.5 % (ref 11.5–15.5)
WBC: 6.3 10*3/uL (ref 4.0–10.5)
nRBC: 0 % (ref 0.0–0.2)

## 2019-11-30 LAB — POCT PREGNANCY, URINE: Preg Test, Ur: NEGATIVE

## 2019-11-30 LAB — LIPASE, BLOOD: Lipase: 32 U/L (ref 11–51)

## 2019-11-30 NOTE — ED Triage Notes (Signed)
Pt is two weeks post partum.  C/o LLQ pain and left lower back pain.  This started after delivery.  Pt also feels like her hands and feet have a yellow discoloration.  Eyes are normal color per pt.  VSS. Unlabored.

## 2019-11-30 NOTE — ED Provider Notes (Signed)
[facility] Emergency Department Provider Note  ____________________________________________  Time seen: Approximately 5:58 PM  I have reviewed the triage vital signs and the nursing notes.   HISTORY  Chief Complaint Back Pain    HPI Karen Hickman is a 24 y.o. female no significant past medical history  who is 2 months postpartum from NSVD who complains of a yellowish-orange appearance of her skin particularly in the hands and feet.  Denies any fever or chills, no dysuria frequency urgency.  She has some persistent vaginal discharge since delivery which is unchanged and not increasing.  No other acute symptoms.  She does also have subacute low back pain bilaterally without aggravating or alleviating factors, nonradiating.  Mild for which she has not taken any pain medications recently.  Her newborn son is doing well, breast-feeding.     History reviewed. No pertinent past medical history.   There are no problems to display for this patient.    History reviewed. No pertinent surgical history.   Prior to Admission medications   Medication Sig Start Date End Date Taking? Authorizing Provider  cetirizine (ZYRTEC) 10 MG tablet Take 1 tablet (10 mg total) by mouth daily. 05/31/19   Cuthriell, Delorise Royals, PA-C     Allergies Patient has no known allergies.   History reviewed. No pertinent family history.  Social History Social History   Tobacco Use  . Smoking status: Never Smoker  . Smokeless tobacco: Never Used  Substance Use Topics  . Alcohol use: Not Currently    Comment: rare  . Drug use: Not on file    Review of Systems  Constitutional:   No fever or chills.  ENT:   No sore throat. No rhinorrhea. Cardiovascular:   No chest pain or syncope. Respiratory:   No dyspnea or cough. Gastrointestinal:   Negative for abdominal pain, vomiting and diarrhea.  Musculoskeletal:   Low back pain as above All other systems reviewed and are negative  except as documented above in ROS and HPI.  ____________________________________________   PHYSICAL EXAM:  VITAL SIGNS: ED Triage Vitals  Enc Vitals Group     BP 11/30/19 1710 119/73     Pulse Rate 11/30/19 1710 89     Resp 11/30/19 1710 16     Temp 11/30/19 1710 98.1 F (36.7 C)     Temp Source 11/30/19 1710 Oral     SpO2 11/30/19 1710 100 %     Weight 11/30/19 1713 190 lb (86.2 kg)     Height --      Head Circumference --      Peak Flow --      Pain Score 11/30/19 1734 4     Pain Loc --      Pain Edu? --      Excl. in GC? --     Vital signs reviewed, nursing assessments reviewed.   Constitutional:   Alert and oriented. Non-toxic appearance. Eyes:   Conjunctivae are normal. EOMI. PERRL. ENT      Head:   Normocephalic and atraumatic.      Nose:   Normal.      Mouth/Throat: Normal, moist mucosa      Neck:   No meningismus. Full ROM.  Cardiovascular:   RRR. Symmetric bilateral radial and DP pulses.  No murmurs. Cap refill less than 2 seconds. Respiratory:   Normal respiratory effort without tachypnea/retractions. Breath sounds are clear and equal bilaterally. No wheezes/rales/rhonchi. Gastrointestinal:   Soft and nontender. Non distended. There is no  CVA tenderness.  No rebound, rigidity, or guarding. Musculoskeletal:   Normal range of motion in all extremities. No joint effusions.  No lower extremity tenderness.  No edema. Neurologic:   Normal speech and language.  Motor grossly intact. No acute focal neurologic deficits are appreciated.  Skin:    Skin is warm, dry and intact. No rash noted.  No petechiae, purpura, or bullae.  I am not able to appreciate any skin color changes.  ____________________________________________    LABS (pertinent positives/negatives) (all labs ordered are listed, but only abnormal results are displayed) Labs Reviewed  COMPREHENSIVE METABOLIC PANEL - Abnormal; Notable for the following components:      Result Value   Potassium 3.4 (*)     Glucose, Bld 129 (*)    All other components within normal limits  URINALYSIS, COMPLETE (UACMP) WITH MICROSCOPIC - Abnormal; Notable for the following components:   Color, Urine YELLOW (*)    APPearance CLEAR (*)    Protein, ur 100 (*)    Leukocytes,Ua SMALL (*)    All other components within normal limits  LIPASE, BLOOD  CBC  POCT PREGNANCY, URINE   ____________________________________________   EKG    ____________________________________________    RADIOLOGY  No results found.  ____________________________________________   PROCEDURES Procedures  ____________________________________________    CLINICAL IMPRESSION / ASSESSMENT AND PLAN / ED COURSE  Medications ordered in the ED: Medications - No data to display  Pertinent labs & imaging results that were available during my care of the patient were reviewed by me and considered in my medical decision making (see chart for details).  Karen Hickman was evaluated in Emergency Department on 11/30/2019 for the symptoms described in the history of present illness. She was evaluated in the context of the global COVID-19 pandemic, which necessitated consideration that the patient might be at risk for infection with the SARS-CoV-2 virus that causes COVID-19. Institutional protocols and algorithms that pertain to the evaluation of patients at risk for COVID-19 are in a state of rapid change based on information released by regulatory bodies including the CDC and federal and state organizations. These policies and algorithms were followed during the patient's care in the ED.   Patient presents with subacute low back pain after delivery 2 months ago.  She is currently calm and well-appearing, nontoxic.  Exam is benign and reassuring.  Vital signs are normal.  Lab panel is normal.  LFTs are normal, CBC normal, urinalysis unremarkable.  Stable for discharge with reassurance to follow-up with primary care in 2 days if symptoms  have not resolved.      ____________________________________________   FINAL CLINICAL IMPRESSION(S) / ED DIAGNOSES    Final diagnoses:  Bilateral low back pain without sciatica, unspecified chronicity     ED Discharge Orders    None      Portions of this note were generated with dragon dictation software. Dictation errors may occur despite best attempts at proofreading.   Carrie Mew, MD 11/30/19 [phone]

## 2019-11-30 NOTE — Discharge Instructions (Addendum)
Your lab tests today were all normal.  Please continue to monitor your symptoms and follow up with your doctor this week if symptoms are not resolved in the next 2 days.

## 2019-11-30 NOTE — ED Notes (Signed)
Pt is two months post partum, c/o left lower back pain that has spread around to her right side and abdomen. Also reports yellowing of her toes and fingers.   Pain improves with rest. Denies any urinary symptoms.

## 2020-04-26 IMAGING — US US EXTREM LOW VENOUS*R*
1 series · 13 of 24 positions shown · non-contrast
Comparison: None.

CLINICAL DATA: 23-year-old with right lower extremity pain. 37
weeks pregnant.



[Series 1: us extrem low venous*right* · 0.08mm/px · 13 of 34 slices shown]
[im 1/34]
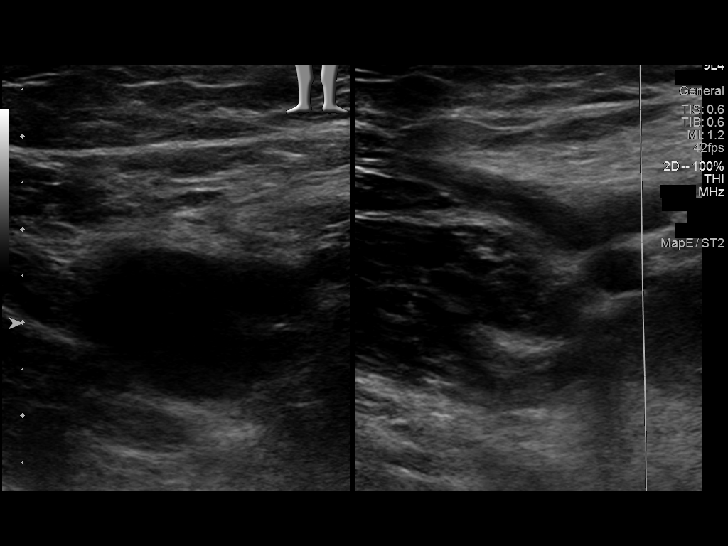
[im 3/34]
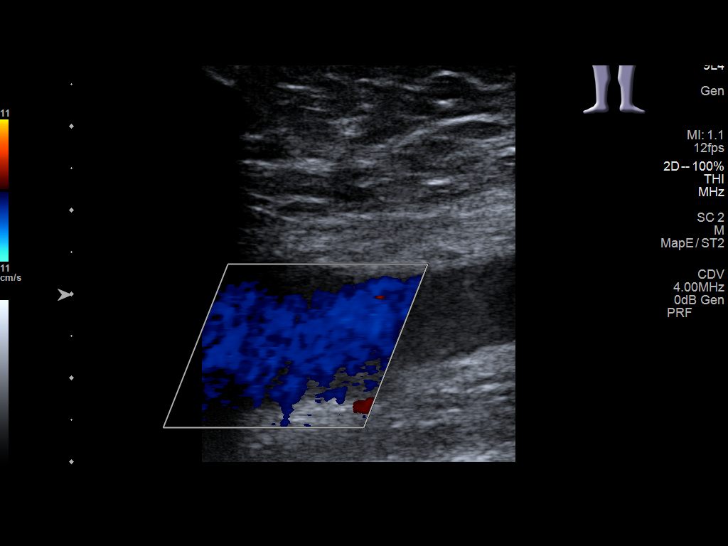
[im 6/34]
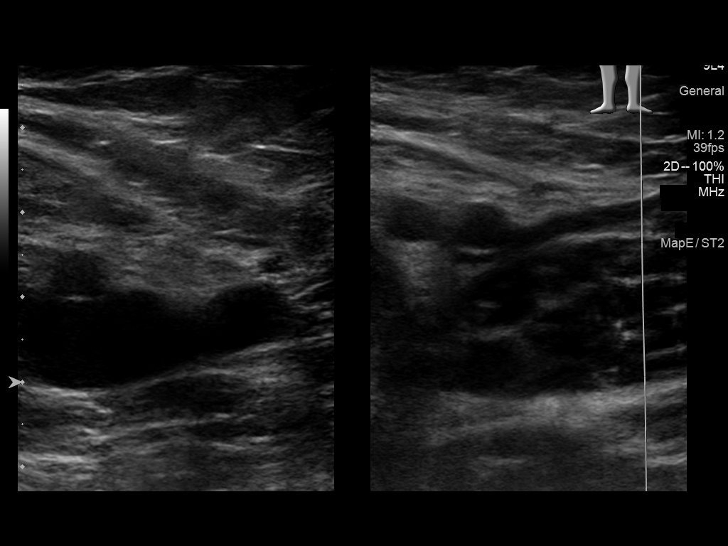
[im 9/34]
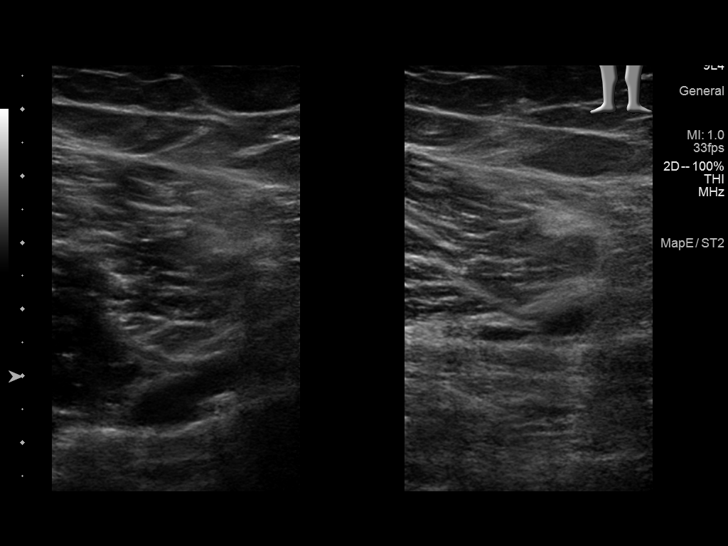
[im 12/34]
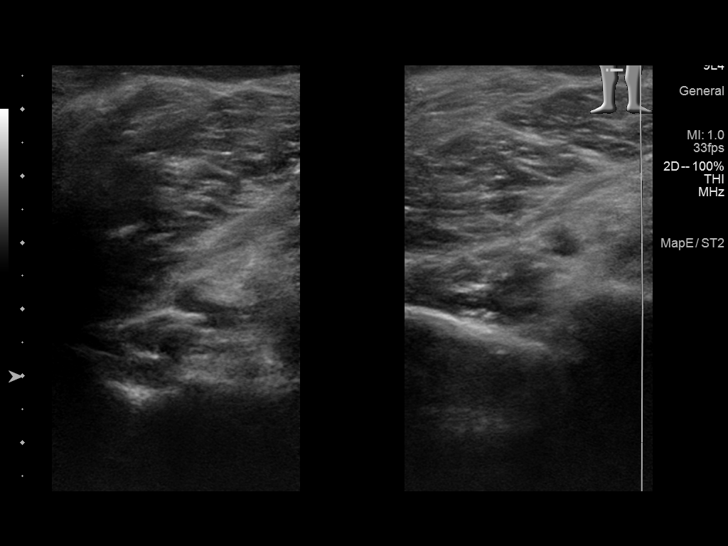
[im 15/34]
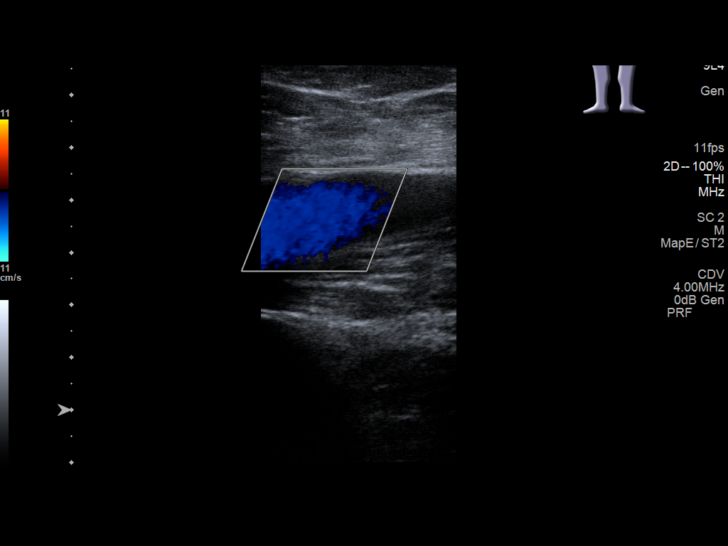
[im 18/34]
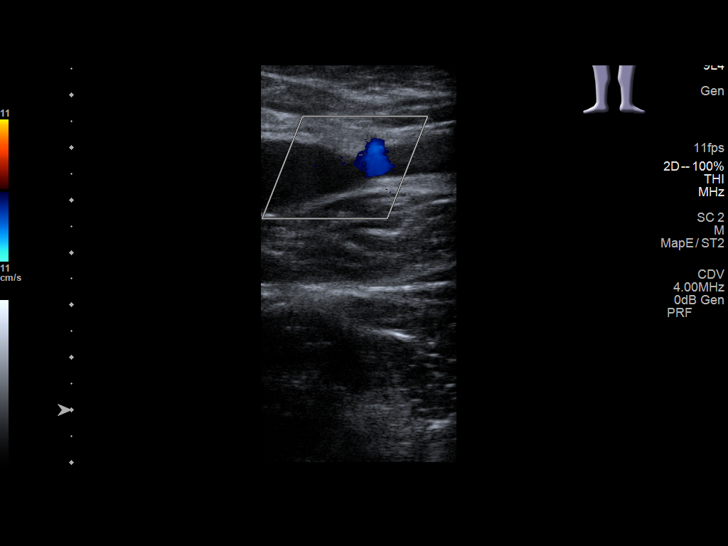
[im 19/34]
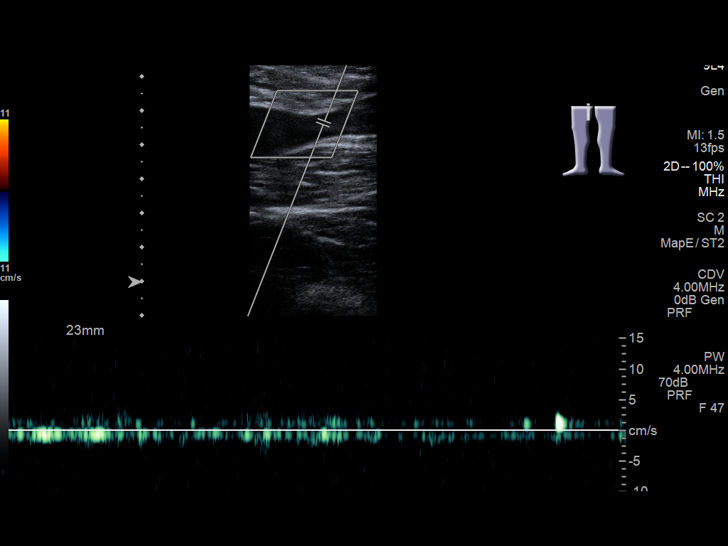
[im 22/34]
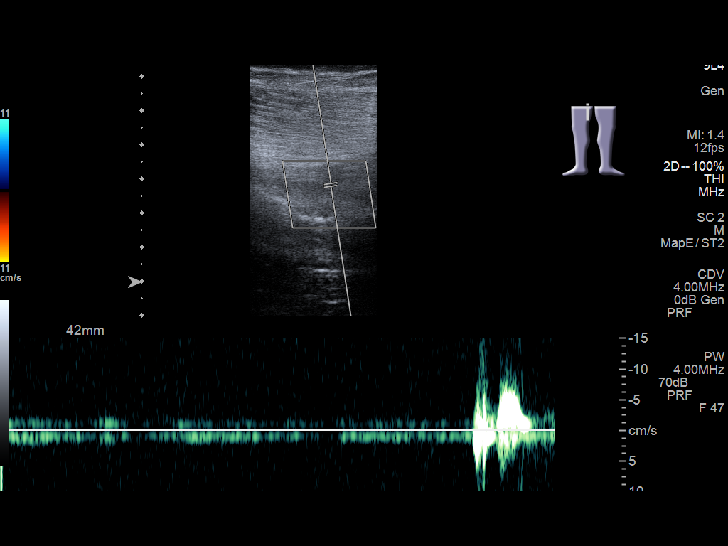
[im 25/34]
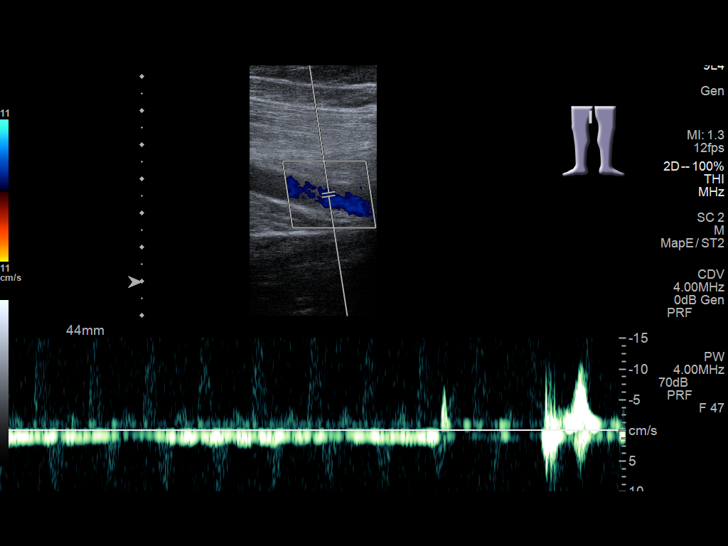
[im 28/34]
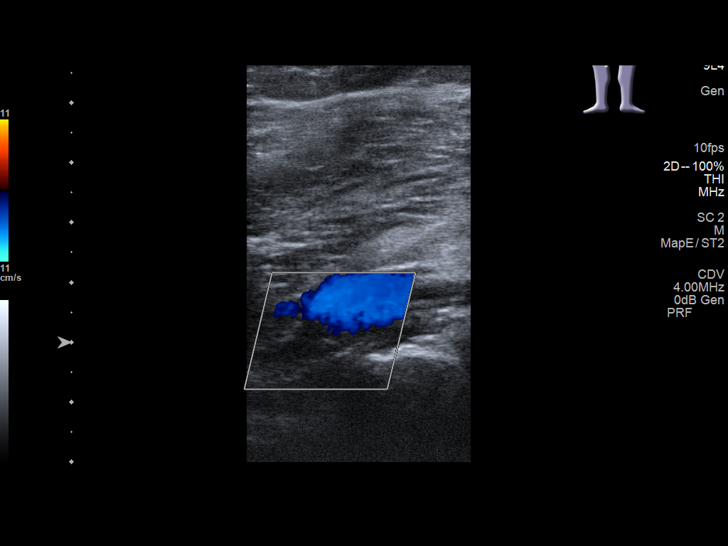
[im 31/34]
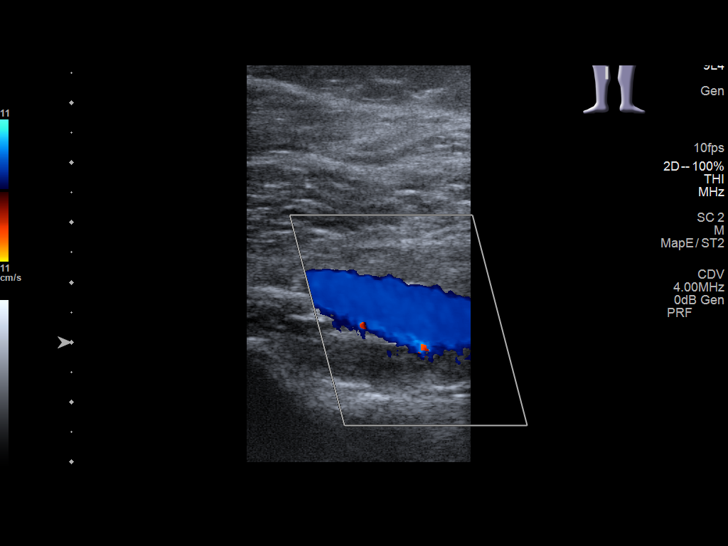
[im 34/34]
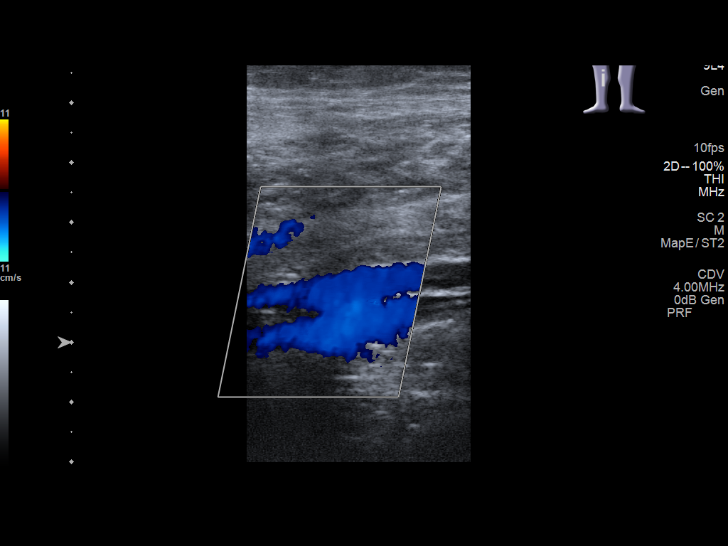

[13 of 24 positions shown; findings below may reference images not displayed]

FINDINGS: Contralateral Common Femoral Vein: Respiratory phasicity is normal
and symmetric with the symptomatic side. No evidence of thrombus.
Normal compressibility.

Common Femoral Vein: No evidence of thrombus. Normal
compressibility, respiratory phasicity and response to augmentation.

Saphenofemoral Junction: No evidence of thrombus. Normal
compressibility and flow on color Doppler imaging.

Profunda Femoral Vein: No evidence of thrombus. Normal
compressibility and flow on color Doppler imaging.

Femoral Vein: No evidence of thrombus. Normal compressibility,
respiratory phasicity and response to augmentation.

Popliteal Vein: No evidence of thrombus. Normal compressibility,
respiratory phasicity and response to augmentation.

Calf Veins: Visualized right deep calf veins are patent without
thrombus.

Other Findings:  None.
IMPRESSION: Negative for deep venous thrombosis in right lower extremity.

## 2020-10-04 ENCOUNTER — Other Ambulatory Visit: Payer: Medicaid Other

## 2020-10-04 DIAGNOSIS — Z20822 Contact with and (suspected) exposure to covid-19: Secondary | ICD-10-CM

## 2020-10-06 LAB — SARS-COV-2, NAA 2 DAY TAT

## 2020-10-06 LAB — NOVEL CORONAVIRUS, NAA: SARS-CoV-2, NAA: NOT DETECTED

## 2022-07-22 LAB — GC/CHLAMYDIA PROBE AMP
Chlamydia trachomatis, NAA: NEGATIVE
Neisseria Gonorrhoeae by PCR: NEGATIVE
